# Patient Record
Sex: Male | Born: 1985 | Race: White | Hispanic: No | Marital: Single | Smoking: Current every day smoker
Health system: Southern US, Community
[De-identification: ages and names within clinical notes are randomized; demographics above are authoritative.]

## PROBLEM LIST (undated history)

## (undated) DIAGNOSIS — F909 Attention-deficit hyperactivity disorder, unspecified type: Secondary | ICD-10-CM

## (undated) HISTORY — DX: Attention-deficit hyperactivity disorder, unspecified type: F90.9

---

## 2020-03-20 ENCOUNTER — Other Ambulatory Visit: Payer: Self-pay

## 2020-03-20 ENCOUNTER — Encounter (HOSPITAL_BASED_OUTPATIENT_CLINIC_OR_DEPARTMENT_OTHER): Payer: Self-pay

## 2020-03-20 ENCOUNTER — Emergency Department (HOSPITAL_BASED_OUTPATIENT_CLINIC_OR_DEPARTMENT_OTHER)
Admission: EM | Admit: 2020-03-20 | Discharge: 2020-03-20 | Disposition: A | Discharge status: Home / Self Care | Payer: BLUE CROSS/BLUE SHIELD | Attending: Emergency Medicine | Admitting: Emergency Medicine

## 2020-03-20 ENCOUNTER — Emergency Department (HOSPITAL_BASED_OUTPATIENT_CLINIC_OR_DEPARTMENT_OTHER): Payer: BLUE CROSS/BLUE SHIELD

## 2020-03-20 DIAGNOSIS — M79675 Pain in left toe(s): Secondary | ICD-10-CM | POA: Diagnosis not present

## 2020-03-20 DIAGNOSIS — F17210 Nicotine dependence, cigarettes, uncomplicated: Secondary | ICD-10-CM | POA: Insufficient documentation

## 2020-03-20 DIAGNOSIS — S70362A Insect bite (nonvenomous), left thigh, initial encounter: Secondary | ICD-10-CM | POA: Diagnosis present

## 2020-03-20 DIAGNOSIS — M79676 Pain in unspecified toe(s): Secondary | ICD-10-CM

## 2020-03-20 DIAGNOSIS — W57XXXA Bitten or stung by nonvenomous insect and other nonvenomous arthropods, initial encounter: Secondary | ICD-10-CM | POA: Diagnosis not present

## 2020-03-20 MED ORDER — NAPROXEN 250 MG PO TABS: 500.0000 mg | ORAL_TABLET | Freq: Two times a day (BID) | ORAL | 0 refills | Status: AC

## 2020-03-20 MED ORDER — DOXYCYCLINE HYCLATE 100 MG PO CAPS: 100.0000 mg | ORAL_CAPSULE | Freq: Two times a day (BID) | ORAL | 0 refills | Status: AC

## 2020-03-20 NOTE — ED Triage Notes (Signed)
Pt c/o pain to left foot "at the first knuckle" x 3 days-denies injury-NAD-steady limping gait

## 2020-03-20 NOTE — Discharge Instructions (Addendum)
You may alternate taking Tylenol and Naproxen as needed for pain control. You may take Naproxen twice daily as directed on your discharge paperwork and you may take  [phone] mg of Tylenol every 6 hours. Do not exceed 4000 mg of Tylenol daily as this can lead to liver damage. Also, make sure to take Naproxen with meals as it can cause an upset stomach. Do not take other NSAIDs while taking Naproxen such as (Aleve, Ibuprofen, Aspirin, Celebrex, etc) and do not take more than the prescribed dose as this can lead to ulcers and bleeding in your GI tract. You may use warm and cold compresses to help with your symptoms.   You were given a prescription for antibiotics to help prevent any infection from the tick bite. Please take the antibiotic prescription fully.    Please follow up with your primary doctor within the next 7-10 days for re-evaluation and further treatment of your symptoms.   Please return to the ER sooner if you have any new or worsening symptoms.

## 2020-03-20 NOTE — ED Notes (Signed)
Left toe joint swelling and pain. Worsening over the past few days

## 2020-03-20 NOTE — ED Provider Notes (Signed)
[facility] EMERGENCY DEPARTMENT Provider Note   CSN: [id number] Arrival date & time: 03/20/20  1647     History Chief Complaint  Patient presents with  . Foot Pain    Darren Kline is a 35 y.o. male.  HPI   35 year old male presenting the emergency department today for evaluation of pain and swelling over the left great toe that started about 3 days ago.  He denies any known injuries or trauma.  He has some associated redness.  It is painful to touch.  It is also worse with weightbearing.  He denies any recent heavy beer consumption, consumption of red meat or fish.  He does not take any medications.  He has no known history of gout.  Patient further reports that he had a tick on his left upper posterior thigh a few days ago.  He thinks that it was present for several days prior to him noticing it but he removed the tick himself.  He denies any associated systemic symptoms related to this.  History reviewed. No pertinent past medical history.  There are no problems to display for this patient.   History reviewed. No pertinent surgical history.     No family history on file.  Social History   Tobacco Use  . Smoking status: Current Every Day Smoker    Types: Cigarettes  . Smokeless tobacco: Never Used  Substance Use Topics  . Alcohol use: Yes    Comment: weekly  . Drug use: Yes    Types: Marijuana    Home Medications Prior to Admission medications   Medication Sig Start Date End Date Taking? Authorizing Provider  doxycycline (VIBRAMYCIN) 100 MG capsule Take 1 capsule (100 mg total) by mouth 2 (two) times daily for 7 days. 03/20/20 03/27/20 Yes Couture, Cortni S, PA-C  naproxen (NAPROSYN) 250 MG tablet Take 2 tablets (500 mg total) by mouth 2 (two) times daily for 7 days. 03/20/20 03/27/20 Yes Couture, Cortni S, PA-C    Allergies    Patient has no known allergies.  Review of Systems   Review of Systems  Constitutional: Negative for fever.  Respiratory:  Negative for shortness of breath.   Cardiovascular: Negative for chest pain.  Gastrointestinal: Negative for abdominal pain.  Musculoskeletal: Negative for myalgias.       Left great toe pain  Skin: Positive for color change. Negative for rash.  Neurological: Negative for headaches.    Physical Exam Updated Vital Signs BP 117/87   Pulse 69   Temp 97.6 F (36.4 C) (Oral)   Resp 16   SpO2 98%   Physical Exam Constitutional:      General: He is not in acute distress.    Appearance: He is well-developed.  Eyes:     Conjunctiva/sclera: Conjunctivae normal.  Cardiovascular:     Rate and Rhythm: Normal rate.  Pulmonary:     Effort: Pulmonary effort is normal.  Musculoskeletal:     Comments: Left 1st MTP joint is erythematous and TTP. There is minimal swelling. Small papule noted to the left upper posterior thigh. No target lesion or rashes noted.  Skin:    General: Skin is warm and dry.  Neurological:     Mental Status: He is alert and oriented to person, place, and time.     ED Results / Procedures / Treatments   Labs (all labs ordered are listed, but only abnormal results are displayed) Labs Reviewed - No data to display  EKG None  Radiology DG Foot  Complete Left  Result Date: 03/20/2020 CLINICAL DATA:  Left foot pain.  Pain for 3 days. EXAM: LEFT FOOT - COMPLETE 3+ VIEW COMPARISON:  None. FINDINGS: There is no evidence of fracture or dislocation. There is no evidence of arthropathy or other focal bone abnormality. Soft tissues are unremarkable. IMPRESSION: Negative radiographs of the left foot. Electronically Signed   By: Narda Rutherford M.D.   On: 03/20/2020 18:53    Procedures Procedures   Medications Ordered in ED Medications - No data to display  ED Course  I have reviewed the triage vital signs and the nursing notes.  Pertinent labs & imaging results that were available during my care of the patient were reviewed by me and considered in my medical  decision making (see chart for details).    MDM Rules/Calculators/A&P                          Pt presents with monoarticular pain, swelling and erythema.  Pt is afebrile and stable. Imaging reviewed, no evidence of occult fracture or injury.  Pt without known peptic ulcer disease and not receiving concurrent treatment on warfarin. Will give rx for naproxen. Discussed that pt should respond to treatment with in 24 hour of begining treatment & likely resolve in 2-3 days. In regards to his tick bite he will be given rx for doxycycline. Advised pcp f/u and strict return precautions. He voices understanding of the plan and reasons to return. All questions answered, pt stable for discharge.     Final Clinical Impression(s) / ED Diagnoses Final diagnoses:  Pain of toe, unspecified laterality  Tick bite, unspecified site, initial encounter    Rx / DC Orders ED Discharge Orders         Ordered    naproxen (NAPROSYN) 250 MG tablet  2 times daily        03/20/20 1829    doxycycline (VIBRAMYCIN) 100 MG capsule  2 times daily        03/20/20 [address] [place], PA-C 03/20/20 2330    Charlynne Pander, MD 03/20/20 2352

## 2020-03-20 NOTE — ED Notes (Signed)
Patient transported to X-ray 

## 2021-08-12 NOTE — Progress Notes (Unsigned)
   I, Darren Kline, LAT, ATC acting as a scribe for Darren Graham, MD.  Subjective:    CC: L Great toe pain  HPI: Pt is a 36 y/o male c/o Great toe pain ongoing for the past 3 weeks. Pt note pain had calmed down and now has worsened over the last 3 days. Pt works on the [employer] and is on his feet a lot for work. Pt was seen at the [facility] ED on 04/01/20 c/o L Great toe pain. Pt locates pain to the head of the first MT/1st MTP joint.  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  Swelling: yes- redness Aggravates: walking Treatments tried: decreased soda intake, increased water consumption,   Dx imaging: L foot XR  Pertinent review of Systems: No fevers or chills  Relevant historical information: No known history of gout   Objective:    Vitals:   08/13/21 0956  BP: (!) 102/7  Pulse: (!) 103  SpO2: 99%   General: Well Developed, well nourished, and in no acute distress.   MSK: Left great toe erythematous swollen tender palpation decreased range of motion.  Lab and Radiology Results  X-ray images left great toe obtained today personally and independently interpreted No acute fractures.  No severe DJD.  No bone erosions present at MTP. Await formal radiology review    Impression and Recommendations:    Assessment and Plan: 36 y.o. male with left great toe pain and swelling without acute injury or significant degenerative changes.  This is highly concerning for gout/podagra.  Plan to obtain uric acid and metabolic panel.  We will start treatment today with colchicine and anticipate allopurinol once we get uric acid labs back.  Recheck in 1 month.Marland Kitchen  PDMP not reviewed this encounter. Orders Placed This Encounter  Procedures   DG Toe Great Left    Standing Status:   Future    Number of Occurrences:   1    Standing Expiration Date:   09/13/2021    Order Specific Question:   Reason for Exam (SYMPTOM  OR DIAGNOSIS REQUIRED)    Answer:   left great toe pain     Order Specific Question:   Preferred imaging location?    Answer:   [facility]   Uric acid    Standing Status:   Future    Number of Occurrences:   1    Standing Expiration Date:   08/14/2022   Comprehensive metabolic panel    Standing Status:   Future    Number of Occurrences:   1    Standing Expiration Date:   08/14/2022    Order Specific Question:   Has the patient fasted?    Answer:   No   Meds ordered this encounter  Medications   colchicine 0.6 MG tablet    Sig: Take 1 tablet (0.6 mg total) by mouth daily.    Dispense:  60 tablet    Refill:  1    Discussed warning signs or symptoms. Please see discharge instructions. Patient expresses understanding.   The above documentation has been reviewed and is accurate and complete Darren Kline, M.D.

## 2021-08-13 ENCOUNTER — Ambulatory Visit: Payer: 59 | Admitting: Family Medicine

## 2021-08-13 ENCOUNTER — Ambulatory Visit (INDEPENDENT_AMBULATORY_CARE_PROVIDER_SITE_OTHER): Payer: 59

## 2021-08-13 VITALS — BP 102/7 | HR 103 | Ht 63.0 in | Wt 148.2 lb

## 2021-08-13 DIAGNOSIS — M79675 Pain in left toe(s): Secondary | ICD-10-CM | POA: Diagnosis not present

## 2021-08-13 LAB — COMPREHENSIVE METABOLIC PANEL
ALT: 29 U/L (ref 0–53)
AST: 20 U/L (ref 0–37)
Albumin: 4.7 g/dL (ref 3.5–5.2)
Alkaline Phosphatase: 68 U/L (ref 39–117)
BUN: 9 mg/dL (ref 6–23)
CO2: 29 mEq/L (ref 19–32)
Calcium: 9.7 mg/dL (ref 8.4–10.5)
Chloride: 100 mEq/L (ref 96–112)
Creatinine, Ser: 1.05 mg/dL (ref 0.40–1.50)
GFR: 90.95 mL/min (ref >60.00)
Glucose, Bld: 103 mg/dL — ABNORMAL HIGH (ref 70–99)
Potassium: 3.8 mEq/L (ref 3.5–5.1)
Sodium: 136 mEq/L (ref 135–145)
Total Bilirubin: 0.4 mg/dL (ref 0.2–1.2)
Total Protein: 8.1 g/dL (ref 6.0–8.3)

## 2021-08-13 LAB — URIC ACID: Uric Acid, Serum: 6.7 mg/dL (ref 4.0–7.8)

## 2021-08-13 MED ORDER — COLCHICINE 0.6 MG PO TABS: 0.6000 mg | ORAL_TABLET | Freq: Every day | ORAL | 1 refills | Status: DC

## 2021-08-13 NOTE — Patient Instructions (Addendum)
Thank you for coming in today.   Please get labs today before you leave   Please get an Xray today before you leave   I've sent a prescription for Colchicine to your pharmacy. Start taking this now.  Check back in 1 month

## 2021-08-14 ENCOUNTER — Encounter: Payer: Self-pay | Admitting: Family Medicine

## 2021-08-14 MED ORDER — ALLOPURINOL 300 MG PO TABS: 150.0000 mg | ORAL_TABLET | Freq: Every day | ORAL | 3 refills | Status: DC

## 2021-08-14 NOTE — Addendum Note (Signed)
Addended by: Rodolph Bong on: 08/14/2021 06:46 AM   Modules accepted: Orders

## 2021-08-14 NOTE — Progress Notes (Signed)
Toe x-ray looks normal to radiology.

## 2021-08-14 NOTE — Progress Notes (Signed)
Uric acid is 6.7.  Goal for gout is less than 6.  This indicates that gout is likely the cause of your pain.  I have added allopurinol.  Take one half of a pill daily. We will recheck the lab when you return. I also prescribed colchicine when you were here last time I would like you to take that as well is that will stop gout.

## 2021-09-17 ENCOUNTER — Ambulatory Visit: Payer: 59 | Admitting: Family Medicine

## 2021-09-17 NOTE — Progress Notes (Deleted)
   I, Darren Kline, LAT, ATC acting as a scribe for Darren Graham, MD.  Darren Kline is a 36 y.o. male who presents to [facility] today for f/u L Great toe pain due to a gout flare. Pt was last seen by Dr. Denyse Amass on 08/13/21 and labs were obtained and he was prescribed colchicine. Once lab results were back, pt was also prescribed allopurinol. Today, pt reports   Dx testing: 08/13/21 L Great toe XR 08/13/21 Labs (uric acid 6.7, CMet)  03/20/20 L foot XR  Pertinent review of systems: ***  Relevant historical information: ***   Exam:  There were no vitals taken for this visit. General: Well Developed, well nourished, and in no acute distress.   MSK: ***    Lab and Radiology Results No results found for this or any previous visit (from the past 72 hour(s)). No results found.     Assessment and Plan: 36 y.o. male with ***   PDMP not reviewed this encounter. No orders of the defined types were placed in this encounter.  No orders of the defined types were placed in this encounter.    Discussed warning signs or symptoms. Please see discharge instructions. Patient expresses understanding.   ***

## 2022-03-20 IMAGING — DX DG FOOT COMPLETE 3+V*L*
3 series · 3 of 3 positions shown · non-contrast
Comparison: None.

CLINICAL DATA: Left foot pain.  Pain for 3 days.

EXAM:
LEFT FOOT - COMPLETE 3+ VIEW

[foot ap]
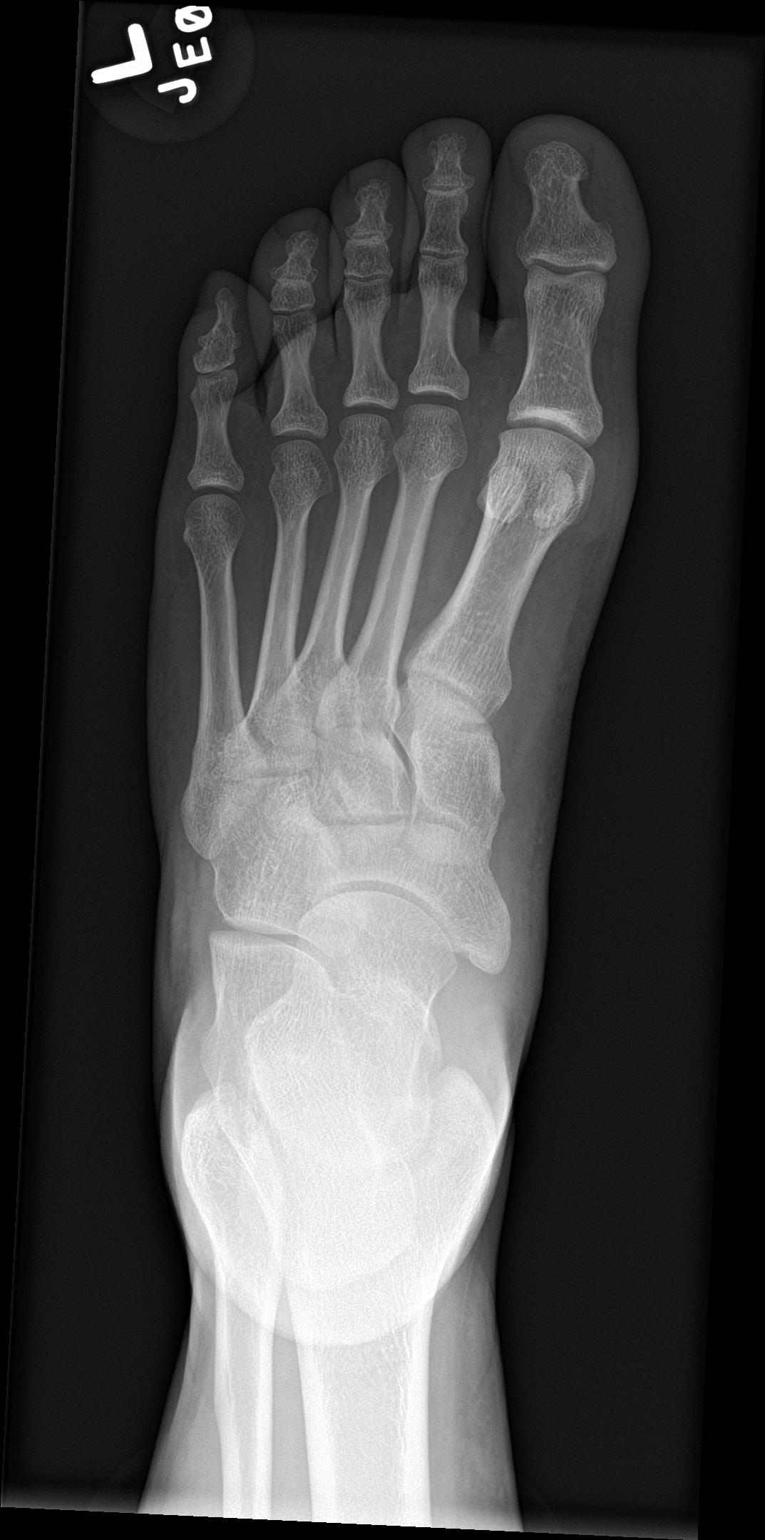

[foot obl]
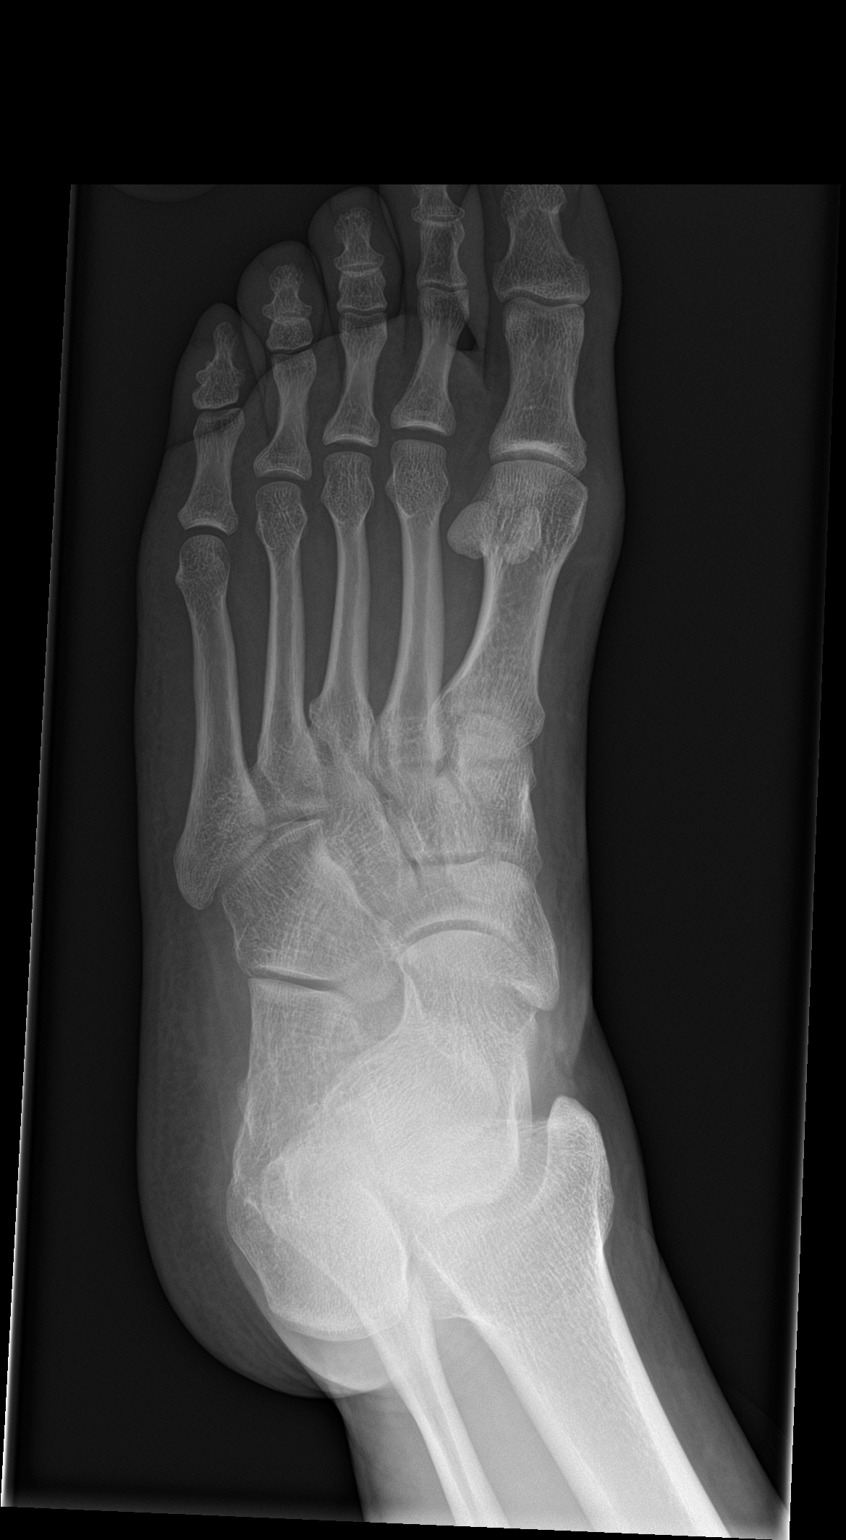

[foot lat]
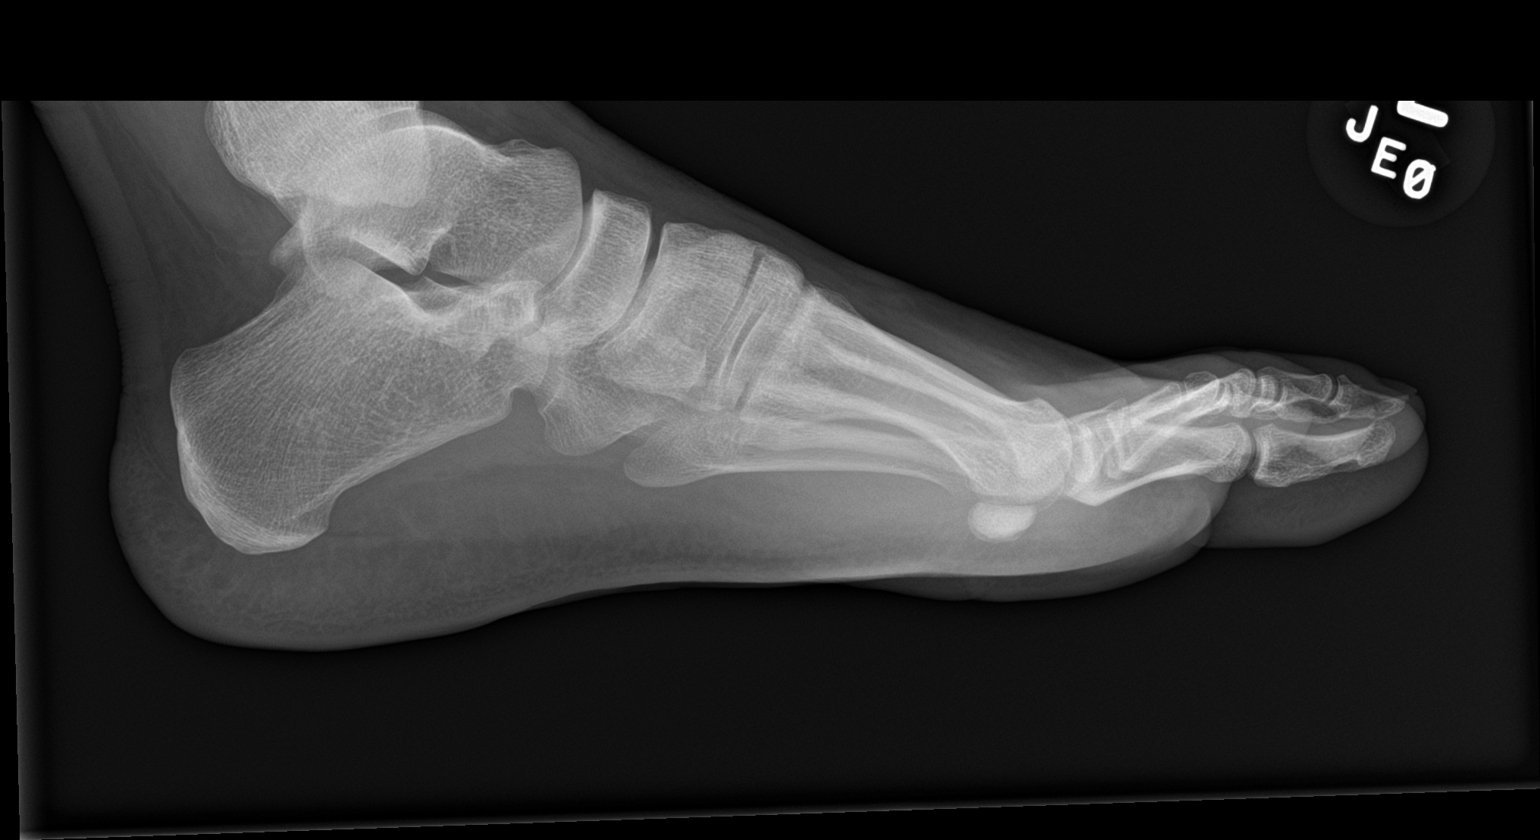

[3 of 3 positions shown; findings below may reference images not displayed]

FINDINGS: There is no evidence of fracture or dislocation. There is no
evidence of arthropathy or other focal bone abnormality. Soft
tissues are unremarkable.
IMPRESSION: Negative radiographs of the left foot.

## 2022-08-06 ENCOUNTER — Other Ambulatory Visit: Payer: Self-pay

## 2022-08-06 ENCOUNTER — Encounter (HOSPITAL_COMMUNITY): Payer: Self-pay

## 2022-08-06 ENCOUNTER — Emergency Department (HOSPITAL_COMMUNITY)
Admission: EM | Admit: 2022-08-06 | Discharge: 2022-08-06 | Disposition: A | Discharge status: Home / Self Care | Payer: 59 | Source: Home / Self Care | Attending: Student | Admitting: Student

## 2022-08-06 ENCOUNTER — Emergency Department (HOSPITAL_COMMUNITY): Payer: 59

## 2022-08-06 DIAGNOSIS — R1031 Right lower quadrant pain: Secondary | ICD-10-CM | POA: Diagnosis present

## 2022-08-06 DIAGNOSIS — N453 Epididymo-orchitis: Secondary | ICD-10-CM | POA: Diagnosis not present

## 2022-08-06 LAB — CBC WITH DIFFERENTIAL/PLATELET
Abs Immature Granulocytes: 0.02 10*3/uL (ref 0.00–0.07)
Basophils Absolute: 0.1 10*3/uL (ref 0.0–0.1)
Basophils Relative: 1 %
Eosinophils Absolute: 0.9 10*3/uL — ABNORMAL HIGH (ref 0.0–0.5)
Eosinophils Relative: 7 %
HCT: 44.1 % (ref 39.0–52.0)
Hemoglobin: 14.9 g/dL (ref 13.0–17.0)
Immature Granulocytes: 0 %
Lymphocytes Relative: 20 %
Lymphs Abs: 2.5 10*3/uL (ref 0.7–4.0)
MCH: 29.1 pg (ref 26.0–34.0)
MCHC: 33.8 g/dL (ref 30.0–36.0)
MCV: 86.1 fL (ref 80.0–100.0)
Monocytes Absolute: 0.9 10*3/uL (ref 0.1–1.0)
Monocytes Relative: 7 %
Neutro Abs: 8.2 10*3/uL — ABNORMAL HIGH (ref 1.7–7.7)
Neutrophils Relative %: 65 %
Platelets: 361 10*3/uL (ref 150–400)
RBC: 5.12 MIL/uL (ref 4.22–5.81)
RDW: 13.7 % (ref 11.5–15.5)
WBC: 12.6 10*3/uL — ABNORMAL HIGH (ref 4.0–10.5)
nRBC: 0 % (ref 0.0–0.2)

## 2022-08-06 LAB — URINALYSIS, ROUTINE W REFLEX MICROSCOPIC
Bilirubin Urine: NEGATIVE
Glucose, UA: NEGATIVE mg/dL
Hgb urine dipstick: NEGATIVE
Ketones, ur: NEGATIVE mg/dL
Leukocytes,Ua: NEGATIVE
Nitrite: NEGATIVE
Protein, ur: NEGATIVE mg/dL
Specific Gravity, Urine: 1.026 (ref 1.005–1.030)
pH: 5 (ref 5.0–8.0)

## 2022-08-06 LAB — COMPREHENSIVE METABOLIC PANEL
ALT: 18 U/L (ref 0–44)
AST: 18 U/L (ref 15–41)
Albumin: 3.7 g/dL (ref 3.5–5.0)
Alkaline Phosphatase: 58 U/L (ref 38–126)
Anion gap: 8 (ref 5–15)
BUN: 11 mg/dL (ref 6–20)
CO2: 28 mmol/L (ref 22–32)
Calcium: 8.9 mg/dL (ref 8.9–10.3)
Chloride: 102 mmol/L (ref 98–111)
Creatinine, Ser: 1.04 mg/dL (ref 0.61–1.24)
GFR, Estimated: >60 mL/min (ref >60)
Glucose, Bld: 94 mg/dL (ref 70–99)
Potassium: 3.7 mmol/L (ref 3.5–5.1)
Sodium: 138 mmol/L (ref 135–145)
Total Bilirubin: 0.6 mg/dL (ref 0.3–1.2)
Total Protein: 6.5 g/dL (ref 6.5–8.1)

## 2022-08-06 LAB — LIPASE, BLOOD: Lipase: 40 U/L (ref 11–51)

## 2022-08-06 MED ORDER — ONDANSETRON HCL 4 MG/2ML IJ SOLN
4.0000 mg | Freq: Once | INTRAMUSCULAR | Status: AC
  Administered 2022-08-06: 4 mg via INTRAVENOUS
  Filled 2022-08-06: qty 2

## 2022-08-06 MED ORDER — LEVOFLOXACIN 500 MG PO TABS: 500.0000 mg | ORAL_TABLET | Freq: Every day | ORAL | 0 refills | Status: AC

## 2022-08-06 MED ORDER — KETOROLAC TROMETHAMINE 15 MG/ML IJ SOLN
15.0000 mg | Freq: Once | INTRAMUSCULAR | Status: AC
Start: 2022-08-06 — End: 2022-08-06
  Administered 2022-08-06: 15 mg via INTRAVENOUS
  Filled 2022-08-06: qty 1

## 2022-08-06 MED ORDER — SODIUM CHLORIDE 0.9 % IV SOLN
1000.0000 mg | Freq: Once | INTRAVENOUS | Status: AC
  Administered 2022-08-06: 1000 mg via INTRAVENOUS
  Filled 2022-08-06: qty 10

## 2022-08-06 MED ORDER — HYDROMORPHONE HCL 1 MG/ML IJ SOLN
1.0000 mg | Freq: Once | INTRAMUSCULAR | Status: AC
  Administered 2022-08-06: 1 mg via INTRAVENOUS
  Filled 2022-08-06: qty 1

## 2022-08-06 NOTE — ED Notes (Signed)
Patient transported to Ultrasound 

## 2022-08-06 NOTE — ED Triage Notes (Signed)
RLQ abdominal pain described as a gassy/ cramping feeling that began yesterday afternoon.

## 2022-08-06 NOTE — Discharge Instructions (Signed)
We recommend Aleve twice a day for pain. Take Levaquin as prescribed until finished for coverage of a bacterial infection. If symptoms persist, follow up with Urology; referral information below. Return to the ED for new or concerning symptoms.

## 2022-08-06 NOTE — ED Provider Notes (Signed)
[place] EMERGENCY DEPARTMENT AT [facility] Provider Note   CSN: [id number] Arrival date & time: 08/06/22  0310     History {Add pertinent medical, surgical, social history, OB history to HPI:1} Chief Complaint  Patient presents with   Abdominal Pain    Darren Kline is a 37 y.o. male.  37 year old male presents to the emergency department for evaluation of right lower quadrant cramping abdominal pain.  States that pain began yesterday and has been constant since the afternoon.  It worsened this evening.  He feels as though the pain is associated with discomfort in his right hemiscrotum which is aggravated by movement.  He has not noted any difficulty voiding, dysuria, scrotal swelling, penile discharge, fevers, nausea, vomiting.  No reported bowel changes or prior abdominal surgeries.  Denies any recent genital trauma.  The history is provided by the patient. No language interpreter was used.  Abdominal Pain      Home Medications Prior to Admission medications   Medication Sig Start Date End Date Taking? Authorizing Provider  allopurinol (ZYLOPRIM) 300 MG tablet Take 0.5 tablets (150 mg total) by mouth daily. 08/14/21   Rodolph Bong, MD  amphetamine-dextroamphetamine (ADDERALL) 20 MG tablet Take 20 mg by mouth daily.    [provider]  colchicine 0.6 MG tablet Take 1 tablet (0.6 mg total) by mouth daily. 08/13/21   Rodolph Bong, MD      Allergies    Patient has no known allergies.    Review of Systems   Review of Systems  Gastrointestinal:  Positive for abdominal pain.  Ten systems reviewed and are negative for acute change, except as noted in the HPI.    Physical Exam Updated Vital Signs BP 114/80 (BP Location: Right Arm)   Pulse (!) 59   Temp 97.7 F (36.5 C)   Resp 20   Ht 5\' 3"  (1.6 m)   Wt 68 kg   SpO2 100%   BMI 26.57 kg/m   Physical Exam Vitals and nursing note reviewed.  Constitutional:      General: He is not in acute distress.     Appearance: He is well-developed. He is not diaphoretic.     Comments: Appears uncomfortable, but nontoxic.  HENT:     Head: Normocephalic and atraumatic.  Eyes:     General: No scleral icterus.    Conjunctiva/sclera: Conjunctivae normal.  Cardiovascular:     Rate and Rhythm: Normal rate and regular rhythm.     Pulses: Normal pulses.  Pulmonary:     Effort: Pulmonary effort is normal. No respiratory distress.     Breath sounds: No stridor. No wheezing.     Comments: Respirations even and unlabored. Abdominal:     Palpations: Abdomen is soft. There is no mass.     Comments: Abdomen soft, nondistended. Relatively nontender to palpation.  Genitourinary:    Comments: Exam chaperoned by PA-S. No scrotal edema, testicular masses. Some TTP when lifting the R hemiscrotum, but no discrete testicular TTP. R testicle does not appear high riding. No direct or indirect inguinal hernia palpated. Penis uncircumcised.  Musculoskeletal:        General: Normal range of motion.     Cervical back: Normal range of motion.  Skin:    General: Skin is warm and dry.     Coloration: Skin is not pale.     Findings: No erythema or rash.  Neurological:     Mental Status: He is alert and oriented to person,  place, and time.     Coordination: Coordination normal.  Psychiatric:        Behavior: Behavior normal.     ED Results / Procedures / Treatments   Labs (all labs ordered are listed, but only abnormal results are displayed) Labs Reviewed  CBC WITH DIFFERENTIAL/PLATELET - Abnormal; Notable for the following components:      Result Value   WBC 12.6 (*)    Neutro Abs 8.2 (*)    Eosinophils Absolute 0.9 (*)    All other components within normal limits  LIPASE, BLOOD  COMPREHENSIVE METABOLIC PANEL  URINALYSIS, ROUTINE W REFLEX MICROSCOPIC    EKG None  Radiology No results found.  Procedures Procedures  {Document cardiac monitor, telemetry assessment procedure when  appropriate:1}  Medications Ordered in ED Medications  HYDROmorphone (DILAUDID) injection 1 mg (has no administration in time range)  ondansetron (ZOFRAN) injection 4 mg (has no administration in time range)    ED Course/ Medical Decision Making/ A&P   {   Click here for ABCD2, HEART and other calculatorsREFRESH Note before signing :1}                          Medical Decision Making Amount and/or Complexity of Data Reviewed Labs: ordered. Radiology: ordered.  Risk Prescription drug management.   ***  {Document critical care time when appropriate:1} {Document review of labs and clinical decision tools ie heart score, Chads2Vasc2 etc:1}  {Document your independent review of radiology images, and any outside records:1} {Document your discussion with family members, caretakers, and with consultants:1} {Document social determinants of health affecting pt's care:1} {Document your decision making why or why not admission, treatments were needed:1} Final Clinical Impression(s) / ED Diagnoses Final diagnoses:  None    Rx / DC Orders ED Discharge Orders     None

## 2022-09-07 ENCOUNTER — Encounter: Payer: Self-pay | Admitting: Family Medicine

## 2022-09-07 ENCOUNTER — Ambulatory Visit: Payer: 59 | Admitting: Family Medicine

## 2022-09-07 ENCOUNTER — Ambulatory Visit (INDEPENDENT_AMBULATORY_CARE_PROVIDER_SITE_OTHER): Payer: 59

## 2022-09-07 VITALS — BP 124/84 | HR 86 | Temp 98.0°F | Ht 63.0 in | Wt 146.0 lb

## 2022-09-07 DIAGNOSIS — R0789 Other chest pain: Secondary | ICD-10-CM

## 2022-09-07 DIAGNOSIS — Z0001 Encounter for general adult medical examination with abnormal findings: Secondary | ICD-10-CM | POA: Diagnosis not present

## 2022-09-07 DIAGNOSIS — Z13220 Encounter for screening for lipoid disorders: Secondary | ICD-10-CM

## 2022-09-07 DIAGNOSIS — Z1329 Encounter for screening for other suspected endocrine disorder: Secondary | ICD-10-CM

## 2022-09-07 DIAGNOSIS — Z131 Encounter for screening for diabetes mellitus: Secondary | ICD-10-CM

## 2022-09-07 DIAGNOSIS — Z0000 Encounter for general adult medical examination without abnormal findings: Secondary | ICD-10-CM

## 2022-09-07 DIAGNOSIS — F900 Attention-deficit hyperactivity disorder, predominantly inattentive type: Secondary | ICD-10-CM

## 2022-09-07 DIAGNOSIS — F909 Attention-deficit hyperactivity disorder, unspecified type: Secondary | ICD-10-CM | POA: Insufficient documentation

## 2022-09-07 DIAGNOSIS — Z130 Encounter for screening for diseases of the blood and blood-forming organs and certain disorders involving the immune mechanism: Secondary | ICD-10-CM

## 2022-09-07 LAB — BAYER DCA HB A1C WAIVED: HB A1C (BAYER DCA - WAIVED): 4.9 % (ref 4.8–5.6)

## 2022-09-07 NOTE — Patient Instructions (Signed)
Voltaren Gel

## 2022-09-07 NOTE — Progress Notes (Signed)
Darren Kline is a 37 y.o. male presents to office today to establish care and  for annual physical exam examination.    Concerns today include: 1. ADHD and knot to chest wall with pain. He states he has been seeing an online provider for management of his ADHD, has been taking Adderrall since August 2023. He states the knot on his chest has been there for several years. No pain but does pop at times.   Occupation: Financial controller Marital status: engaged Substance use: former heavy alcohol use, denies substance use now Diet: general Exercise: not regular  Last eye exam: over a year ago Last dental exam: over a year ago Immunizations needed: Flu Vaccine: denies  Tdap Vaccine: declines   Past Medical History:  Diagnosis Date   ADHD (attention deficit hyperactivity disorder)    Social History   Socioeconomic History   Marital status: Single    Spouse name: Not on file   Number of children: Not on file   Years of education: Not on file   Highest education level: Not on file  Occupational History   Not on file  Tobacco Use   Smoking status: Every Day    Current packs/day: 0.50    Types: Cigarettes   Smokeless tobacco: Never  Vaping Use   Vaping status: Never Used  Substance and Sexual Activity   Alcohol use: Yes    Comment: 4 beers a week   Drug use: Not Currently    Types: Marijuana   Sexual activity: Not on file  Other Topics Concern   Not on file  Social History Narrative   Not on file   Social Determinants of Health   Financial Resource Strain: Not on file  Food Insecurity: Not on file  Transportation Needs: Not on file  Physical Activity: Not on file  Stress: Not on file  Social Connections: Not on file  Intimate Partner Violence: Not on file   History reviewed. No pertinent surgical history. Family History  Problem Relation Age of Onset   Bipolar disorder Father     Current Outpatient Medications:    amphetamine-dextroamphetamine (ADDERALL) 20 MG  tablet, Take 20 mg by mouth daily., Disp: , Rfl:   No Known Allergies   ROS: Review of Systems Constitutional: negative Eyes: negative Ears, nose, mouth, throat, and face: negative Respiratory: negative Cardiovascular: negative Gastrointestinal: negative Genitourinary:negative Integument/breast: negative Hematologic/lymphatic: negative Musculoskeletal:negative Neurological: negative Behavioral/Psych: positive for decreased concentration  Endocrine: negative Allergic/Immunologic: negative    Physical exam BP 124/84   Pulse 86   Temp 98 F (36.7 C) (Temporal)   Ht 5\' 3"  (1.6 m)   Wt 146 lb (66.2 kg)   SpO2 99%   BMI 25.86 kg/m  General appearance: alert, cooperative, appears stated age, and no distress Head: Normocephalic, without obvious abnormality, atraumatic Eyes: conjunctivae/corneas clear. PERRL, EOM's intact. Fundi benign. Ears: normal TM's and external ear canals both ears Nose: Nares normal. Septum midline. Mucosa normal. No drainage or sinus tenderness. Throat: lips, mucosa, and tongue normal; teeth and gums normal Neck: no adenopathy, no carotid bruit, no JVD, supple, symmetrical, trachea midline, and thyroid not enlarged, symmetric, no tenderness/mass/nodules Back: symmetric, no curvature. ROM normal. No CVA tenderness. Lungs: clear to auscultation bilaterally and normal percussion bilaterally Chest wall: no tenderness, xyphoid process slightly outward protruding Heart: regular rate and rhythm, S1, S2 normal, no murmur, click, rub or gallop and normal apical impulse Abdomen: soft, non-tender; bowel sounds normal; no masses,  no organomegaly Male genitalia: normal,  deferred Rectal: deferred Extremities: extremities normal, atraumatic, no cyanosis or edema Pulses: 2+ and symmetric Skin: Skin color, texture, turgor normal. No rashes or lesions Lymph nodes: Cervical, supraclavicular, and axillary nodes normal. Neurologic: Alert and oriented X 3, normal strength  and tone. Normal symmetric reflexes. Normal coordination and gait    Assessment/ Plan: Eldridge was seen today for new patient (initial visit) and establish care.  Diagnoses and all orders for this visit:  Annual physical exam Health maintenance discussed in detail. Labs pending.  -     CBC with Differential/Platelet -     CMP14+EGFR -     Lipid panel -     Thyroid Panel With TSH -     Bayer DCA Hb A1c Waived  Screening for lipid disorders -     Lipid panel  Screening for deficiency anemia -     CBC with Differential/Platelet  Screening for thyroid disorder -     Thyroid Panel With TSH  Screening for diabetes mellitus -     CMP14+EGFR -     Bayer DCA Hb A1c Waived  Attention deficit hyperactivity disorder (ADHD), predominantly inattentive type Referral to psychiatry place.  -     Ambulatory referral to Psychiatry -     Thyroid Panel With TSH  Chest wall pain Will obtain imaging for further evaluation.  -     DG Chest 2 View; Future      Counseled on healthy lifestyle choices, including diet (rich in fruits, vegetables and lean meats and low in salt and simple carbohydrates) and exercise (at least 30 minutes of moderate physical activity daily).  Patient to follow up in 1 year for annual exam or sooner if needed.  The above assessment and management plan was discussed with the patient. The patient verbalized understanding of and has agreed to the management plan. Patient is aware to call the clinic if symptoms persist or worsen. Patient is aware when to return to the clinic for a follow-up visit. Patient educated on when it is appropriate to go to the emergency department.   Kari Baars, FNP-C [facility] [address] [place], [place] [address] [phone]

## 2022-09-08 LAB — CMP14+EGFR
ALT: 25 IU/L (ref 0–44)
AST: 21 IU/L (ref 0–40)
Albumin: 4.5 g/dL (ref 4.1–5.1)
Alkaline Phosphatase: 69 IU/L (ref 44–121)
BUN/Creatinine Ratio: 13 (ref 9–20)
BUN: 12 mg/dL (ref 6–20)
Bilirubin Total: 0.3 mg/dL (ref 0.0–1.2)
CO2: 22 mmol/L (ref 20–29)
Calcium: 9.4 mg/dL (ref 8.7–10.2)
Chloride: 104 mmol/L (ref 96–106)
Creatinine, Ser: 0.96 mg/dL (ref 0.76–1.27)
Globulin, Total: 2.7 g/dL (ref 1.5–4.5)
Glucose: 92 mg/dL (ref 70–99)
Potassium: 4.7 mmol/L (ref 3.5–5.2)
Sodium: 139 mmol/L (ref 134–144)
Total Protein: 7.2 g/dL (ref 6.0–8.5)
eGFR: 104 mL/min/{1.73_m2} (ref >59)

## 2022-09-08 LAB — THYROID PANEL WITH TSH
Free Thyroxine Index: 2.5 (ref 1.2–4.9)
T3 Uptake Ratio: 28 % (ref 24–39)
T4, Total: 8.8 ug/dL (ref 4.5–12.0)
TSH: 1.23 u[IU]/mL (ref 0.450–4.500)

## 2022-09-08 LAB — CBC WITH DIFFERENTIAL/PLATELET
Basophils Absolute: 0 10*3/uL (ref 0.0–0.2)
Basos: 1 %
EOS (ABSOLUTE): 0.7 10*3/uL — ABNORMAL HIGH (ref 0.0–0.4)
Eos: 10 %
Hematocrit: 42.3 % (ref 37.5–51.0)
Hemoglobin: 14.7 g/dL (ref 13.0–17.7)
Immature Grans (Abs): 0 10*3/uL (ref 0.0–0.1)
Immature Granulocytes: 0 %
Lymphocytes Absolute: 2.5 10*3/uL (ref 0.7–3.1)
Lymphs: 36 %
MCH: 29.4 pg (ref 26.6–33.0)
MCHC: 34.8 g/dL (ref 31.5–35.7)
MCV: 85 fL (ref 79–97)
Monocytes Absolute: 0.4 10*3/uL (ref 0.1–0.9)
Monocytes: 6 %
Neutrophils Absolute: 3.3 10*3/uL (ref 1.4–7.0)
Neutrophils: 47 %
Platelets: 275 10*3/uL (ref 150–450)
RBC: 5 x10E6/uL (ref 4.14–5.80)
RDW: 13.9 % (ref 11.6–15.4)
WBC: 6.9 10*3/uL (ref 3.4–10.8)

## 2022-09-08 LAB — LIPID PANEL
Chol/HDL Ratio: 2.5 ratio (ref 0.0–5.0)
Cholesterol, Total: 153 mg/dL (ref 100–199)
HDL: 62 mg/dL (ref >39)
LDL Chol Calc (NIH): 81 mg/dL (ref 0–99)
Triglycerides: 46 mg/dL (ref 0–149)
VLDL Cholesterol Cal: 10 mg/dL (ref 5–40)

## 2022-09-10 ENCOUNTER — Other Ambulatory Visit: Payer: Self-pay | Admitting: Family Medicine

## 2023-02-20 ENCOUNTER — Encounter (HOSPITAL_BASED_OUTPATIENT_CLINIC_OR_DEPARTMENT_OTHER): Payer: Self-pay | Admitting: Emergency Medicine

## 2023-02-20 ENCOUNTER — Other Ambulatory Visit: Payer: Self-pay

## 2023-02-20 ENCOUNTER — Emergency Department (HOSPITAL_BASED_OUTPATIENT_CLINIC_OR_DEPARTMENT_OTHER)
Admission: EM | Admit: 2023-02-20 | Discharge: 2023-02-20 | Disposition: A | Discharge status: Home / Self Care | Payer: 59 | Attending: Emergency Medicine | Admitting: Emergency Medicine

## 2023-02-20 DIAGNOSIS — J101 Influenza due to other identified influenza virus with other respiratory manifestations: Secondary | ICD-10-CM | POA: Diagnosis not present

## 2023-02-20 DIAGNOSIS — R112 Nausea with vomiting, unspecified: Secondary | ICD-10-CM | POA: Diagnosis present

## 2023-02-20 DIAGNOSIS — Z20822 Contact with and (suspected) exposure to covid-19: Secondary | ICD-10-CM | POA: Diagnosis not present

## 2023-02-20 LAB — CBC WITH DIFFERENTIAL/PLATELET
Abs Immature Granulocytes: 0.02 10*3/uL (ref 0.00–0.07)
Basophils Absolute: 0 10*3/uL (ref 0.0–0.1)
Basophils Relative: 0 %
Eosinophils Absolute: 0.3 10*3/uL (ref 0.0–0.5)
Eosinophils Relative: 3 %
HCT: 48.6 % (ref 39.0–52.0)
Hemoglobin: 17.2 g/dL — ABNORMAL HIGH (ref 13.0–17.0)
Immature Granulocytes: 0 %
Lymphocytes Relative: 11 %
Lymphs Abs: 1 10*3/uL (ref 0.7–4.0)
MCH: 28.8 pg (ref 26.0–34.0)
MCHC: 35.4 g/dL (ref 30.0–36.0)
MCV: 81.3 fL (ref 80.0–100.0)
Monocytes Absolute: 0.4 10*3/uL (ref 0.1–1.0)
Monocytes Relative: 4 %
Neutro Abs: 7.4 10*3/uL (ref 1.7–7.7)
Neutrophils Relative %: 82 %
Platelets: 267 10*3/uL (ref 150–400)
RBC: 5.98 MIL/uL — ABNORMAL HIGH (ref 4.22–5.81)
RDW: 12.9 % (ref 11.5–15.5)
WBC: 9 10*3/uL (ref 4.0–10.5)
nRBC: 0 % (ref 0.0–0.2)

## 2023-02-20 LAB — COMPREHENSIVE METABOLIC PANEL
ALT: 37 U/L (ref 0–44)
AST: 32 U/L (ref 15–41)
Albumin: 4.2 g/dL (ref 3.5–5.0)
Alkaline Phosphatase: 56 U/L (ref 38–126)
Anion gap: 12 (ref 5–15)
BUN: 17 mg/dL (ref 6–20)
CO2: 23 mmol/L (ref 22–32)
Calcium: 9.1 mg/dL (ref 8.9–10.3)
Chloride: 100 mmol/L (ref 98–111)
Creatinine, Ser: 1.02 mg/dL (ref 0.61–1.24)
GFR, Estimated: >60 mL/min (ref >60)
Glucose, Bld: 103 mg/dL — ABNORMAL HIGH (ref 70–99)
Potassium: 4.3 mmol/L (ref 3.5–5.1)
Sodium: 135 mmol/L (ref 135–145)
Total Bilirubin: 1.3 mg/dL — ABNORMAL HIGH (ref 0.0–1.2)
Total Protein: 7.8 g/dL (ref 6.5–8.1)

## 2023-02-20 LAB — RESP PANEL BY RT-PCR (RSV, FLU A&B, COVID)  RVPGX2
Influenza A by PCR: POSITIVE — AB
Influenza B by PCR: NEGATIVE
Resp Syncytial Virus by PCR: NEGATIVE
SARS Coronavirus 2 by RT PCR: NEGATIVE

## 2023-02-20 LAB — LIPASE, BLOOD: Lipase: 31 U/L (ref 11–51)

## 2023-02-20 MED ORDER — ONDANSETRON 4 MG PO TBDP
8.0000 mg | ORAL_TABLET | Freq: Once | ORAL | Status: AC
  Administered 2023-02-20: 8 mg via ORAL
  Filled 2023-02-20: qty 2

## 2023-02-20 MED ORDER — ONDANSETRON 4 MG PO TBDP: 4.0000 mg | ORAL_TABLET | Freq: Three times a day (TID) | ORAL | 0 refills | Status: AC | PRN

## 2023-02-20 NOTE — Discharge Instructions (Signed)
 You were seen for your upper respiratory tract infection (influenza) in the emergency department.   At home, please use Tylenol and ibuprofen for your muscle aches and fevers.  Please use over-the-counter cough medication or tea with honey for your cough.  Take the Zofran  for any nausea or vomiting.  Follow-up with your primary doctor in 2-3 days regarding your visit.  This may be over the phone.  Return immediately to the emergency department if you experience any of the following: Difficulty breathing, or any other concerning symptoms.    Thank you for visiting our Emergency Department. It was a pleasure taking care of you today.

## 2023-02-20 NOTE — ED Notes (Signed)
 Pt tolerated cup of water,  sprite given.  Pt is in no distress.

## 2023-02-20 NOTE — ED Notes (Signed)
 Pt was able to tolerate sprite and is in no distress, left ambulatory

## 2023-02-20 NOTE — ED Provider Notes (Signed)
 [facility] Provider Note   CSN: [id number] Arrival date & time: 02/20/23  1455     History  Chief Complaint  Patient presents with   Emesis    Kaydenn Mclear is a 38 y.o. male.  38 year old male previously healthy who presents emergency department with URI symptoms and nausea and vomiting.  1 week ago patient was sick with congestion, cough, and fevers.  Says his symptoms progressed and yesterday started having right ear pain.  Also has had nausea and vomiting since then.  Has some mild epigastric abdominal pain from nausea and vomiting.       Home Medications Prior to Admission medications   Medication Sig Start Date End Date Taking? Authorizing Provider  ondansetron  (ZOFRAN -ODT) 4 MG disintegrating tablet Take 1 tablet (4 mg total) by mouth every 8 (eight) hours as needed for nausea or vomiting. 02/20/23  Yes Yolande Lamar BROCKS, MD  amphetamine-dextroamphetamine (ADDERALL) 20 MG tablet Take 20 mg by mouth daily.    [provider]      Allergies    Patient has no known allergies.    Review of Systems   Review of Systems  Physical Exam Updated Vital Signs BP 106/80   Pulse (!) 101   Temp 97.6 F (36.4 C) (Oral)   Resp 18   Ht 5' 3 (1.6 m)   Wt 65.8 kg   SpO2 99%   BMI 25.69 kg/m  Physical Exam Vitals and nursing note reviewed.  Constitutional:      General: He is not in acute distress.    Appearance: He is well-developed.  HENT:     Head: Normocephalic and atraumatic.     Right Ear: External ear normal.     Left Ear: External ear normal.     Nose: Nose normal.  Eyes:     Extraocular Movements: Extraocular movements intact.     Conjunctiva/sclera: Conjunctivae normal.     Pupils: Pupils are equal, round, and reactive to light.  Cardiovascular:     Rate and Rhythm: Normal rate and regular rhythm.     Heart sounds: Normal heart sounds.  Pulmonary:     Effort: Pulmonary effort is normal. No respiratory  distress.     Breath sounds: Normal breath sounds.  Abdominal:     General: There is no distension.     Palpations: Abdomen is soft. There is no mass.     Tenderness: There is no abdominal tenderness. There is no guarding.  Musculoskeletal:     Cervical back: Normal range of motion and neck supple.     Right lower leg: No edema.     Left lower leg: No edema.  Skin:    General: Skin is warm and dry.  Neurological:     Mental Status: He is alert. Mental status is at baseline.  Psychiatric:        Mood and Affect: Mood normal.        Behavior: Behavior normal.     ED Results / Procedures / Treatments   Labs (all labs ordered are listed, but only abnormal results are displayed) Labs Reviewed  RESP PANEL BY RT-PCR (RSV, FLU A&B, COVID)  RVPGX2 - Abnormal; Notable for the following components:      Result Value   Influenza A by PCR POSITIVE (*)    All other components within normal limits  COMPREHENSIVE METABOLIC PANEL - Abnormal; Notable for the following components:   Glucose, Bld 103 (*)  Total Bilirubin 1.3 (*)    All other components within normal limits  CBC WITH DIFFERENTIAL/PLATELET - Abnormal; Notable for the following components:   RBC 5.98 (*)    Hemoglobin 17.2 (*)    All other components within normal limits  LIPASE, BLOOD    EKG EKG Interpretation Date/Time:  "Sunday February 20 2023 15:12:54 EST Ventricular Rate:  88 PR Interval:  140 QRS Duration:  92 QT Interval:  370 QTC Calculation: 447 R Axis:   69  Text Interpretation: Normal sinus rhythm Incomplete right bundle branch block Borderline ECG No previous ECGs available Confirmed by Anelise Staron ([id number]) on 02/20/2023 6:27:58 PM  Radiology No results found.  Procedures Procedures    Medications Ordered in ED Medications  ondansetron (ZOFRAN-ODT) disintegrating tablet 8 mg (8 mg Oral Given 02/20/23 1932)    ED Course/ Medical Decision Making/ A&P                                 Medical  Decision Making Amount and/or Complexity of Data Reviewed Labs: ordered.  Risk Prescription drug management.   Kalyb Pilz is a 37 y.o. male previously healthy presents emergency department URI symptoms and nausea and vomiting  Initial Ddx:  URI, gastroenteritis, pneumonia, electrolyte abnormality  MDM/Course:  Patient presents emergency department with URI symptoms.  Also started developing some nausea and vomiting recently.  No significant abdominal tenderness to palpation so do not feel the patient needs cross-sectional imaging at this time.  He had a COVID and flu that showed that he has influenza.  Does not have abnormal lung sounds that suggest a pneumonia so do not feel chest x-ray is indicated.  Suspect that his GI symptoms are likely from his influenza.  He was given Zofran and upon re-evaluation was feeling much better and was able to tolerate p.o.  Given a prescription of Zofran to take at home.  This patient presents to the ED for concern of complaints listed in HPI, this involves an extensive number of treatment options, and is a complaint that carries with it a high risk of complications and morbidity. Disposition including potential need for admission considered.   Dispo: DC Home. Return precautions discussed including, but not limited to, those listed in the AVS. Allowed pt time to ask questions which were answered fully prior to dc.  Records reviewed Outpatient Clinic Notes The following labs were independently interpreted: Chemistry and show no acute abnormality I personally reviewed and interpreted cardiac monitoring: normal sinus rhythm  I personally reviewed and interpreted the pt's EKG: see above for interpretation  I have reviewed the patients home medications and made adjustments as needed  Portions of this note were generated with Dragon dictation software. Dictation errors may occur despite best attempts at proofreading.     Final Clinical Impression(s) / ED  Diagnoses Final diagnoses:  Nausea and vomiting, unspecified vomiting type  Influenza A    Rx / DC Orders ED Discharge Orders          Ordered    ondansetron (ZOFRAN-ODT) 4 MG disintegrating tablet  Every 8 hours PRN        02" /09/25 2029              Yolande Lamar BROCKS, MD 02/20/23 2223

## 2023-02-20 NOTE — ED Notes (Signed)
 Patient provided a cup of ice water for PO challenge.

## 2023-02-20 NOTE — ED Triage Notes (Signed)
 Pt to ED via [facility] with vomiting since Saturday morning; c/o epigastric pain; exposure to flu at home
